# Patient Record
Sex: Female | Born: 1985 | Race: White | Hispanic: No | Marital: Single | Smoking: Former smoker
Health system: Southern US, Community
[De-identification: ages and names within clinical notes are randomized; demographics above are authoritative.]

---
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
## 2006-06-20 IMAGING — CR DG CHEST 2V
1 series · 2 of 2 positions shown · non-contrast
Comparison: none

REASON FOR EXAM: left sided chest pain
COMMENTS:

PROCEDURE:     DXR - DXR CHEST PA (OR AP) AND LATERAL  - June 16, 2004 [DATE]
RESULT:     The lung fields are clear. The heart, mediastinal and osseous
structures show no significant abnormalities.

[Series 1: view not recorded · 0.17mm/px · 2 of 2 slices shown]
[im 1/2]
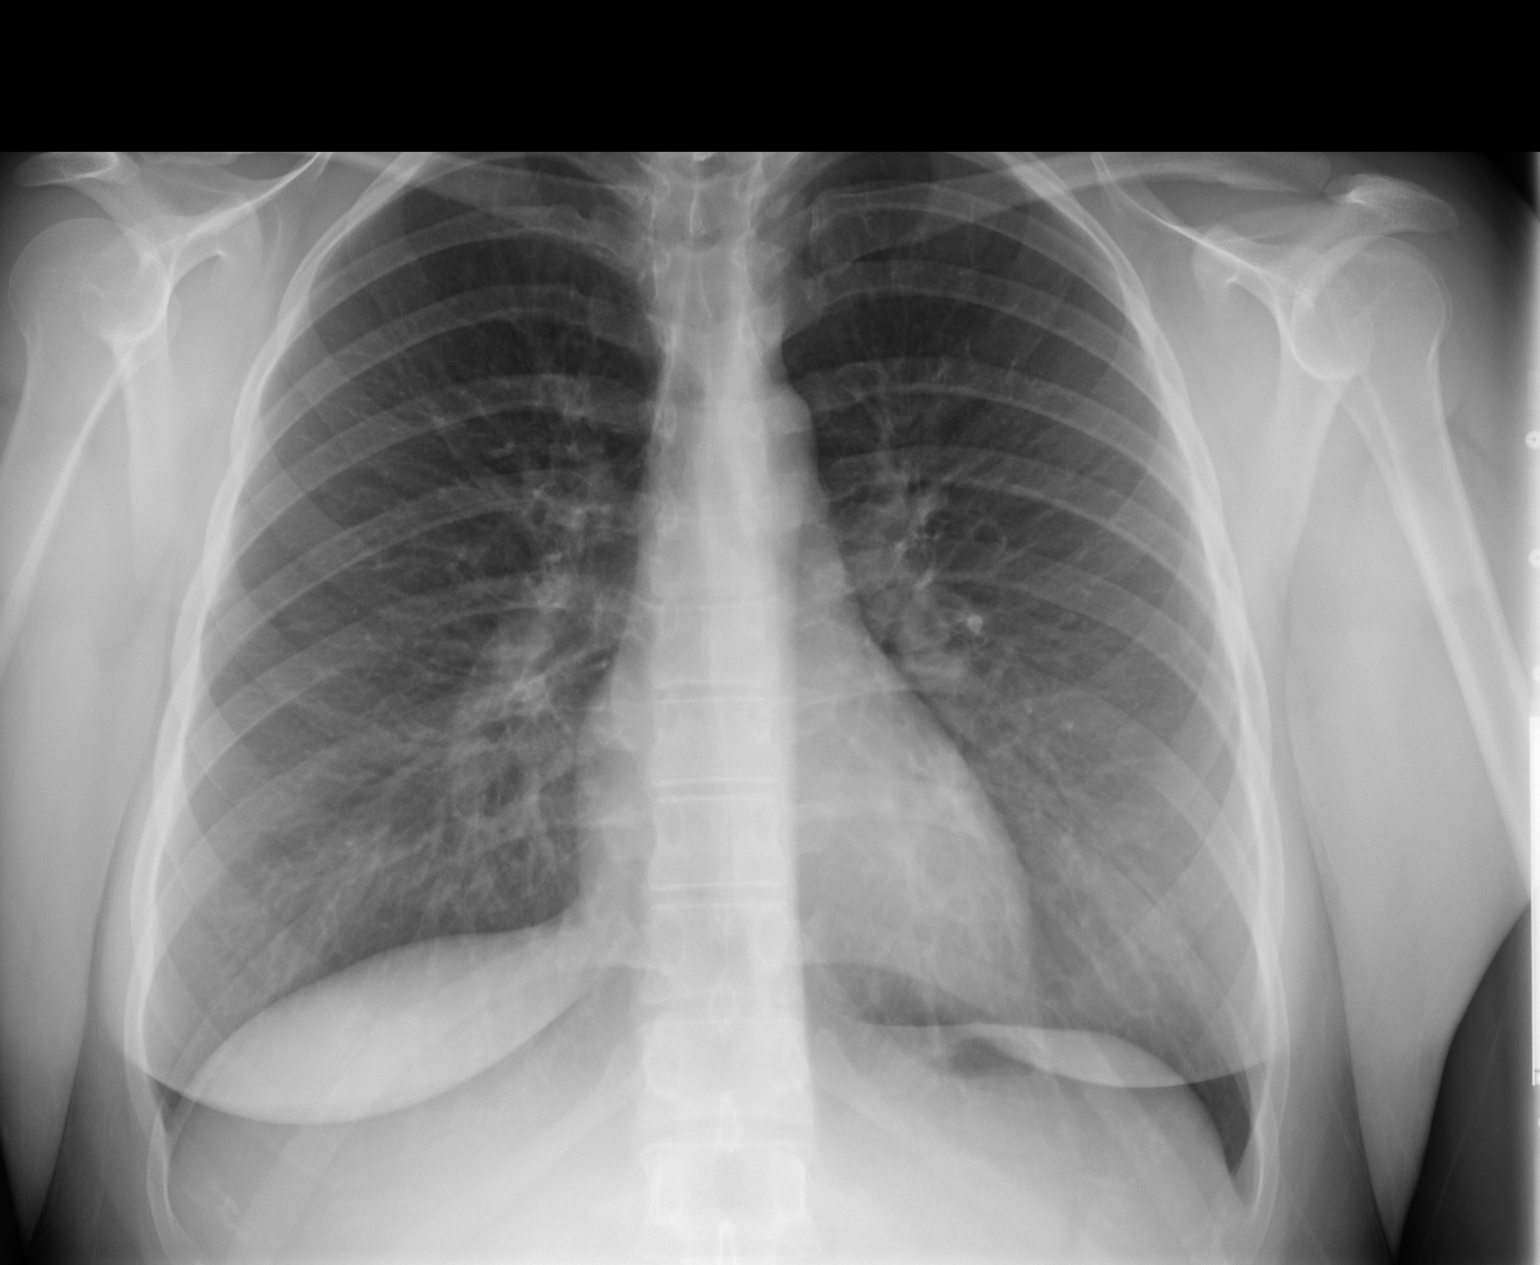
[im 2/2]
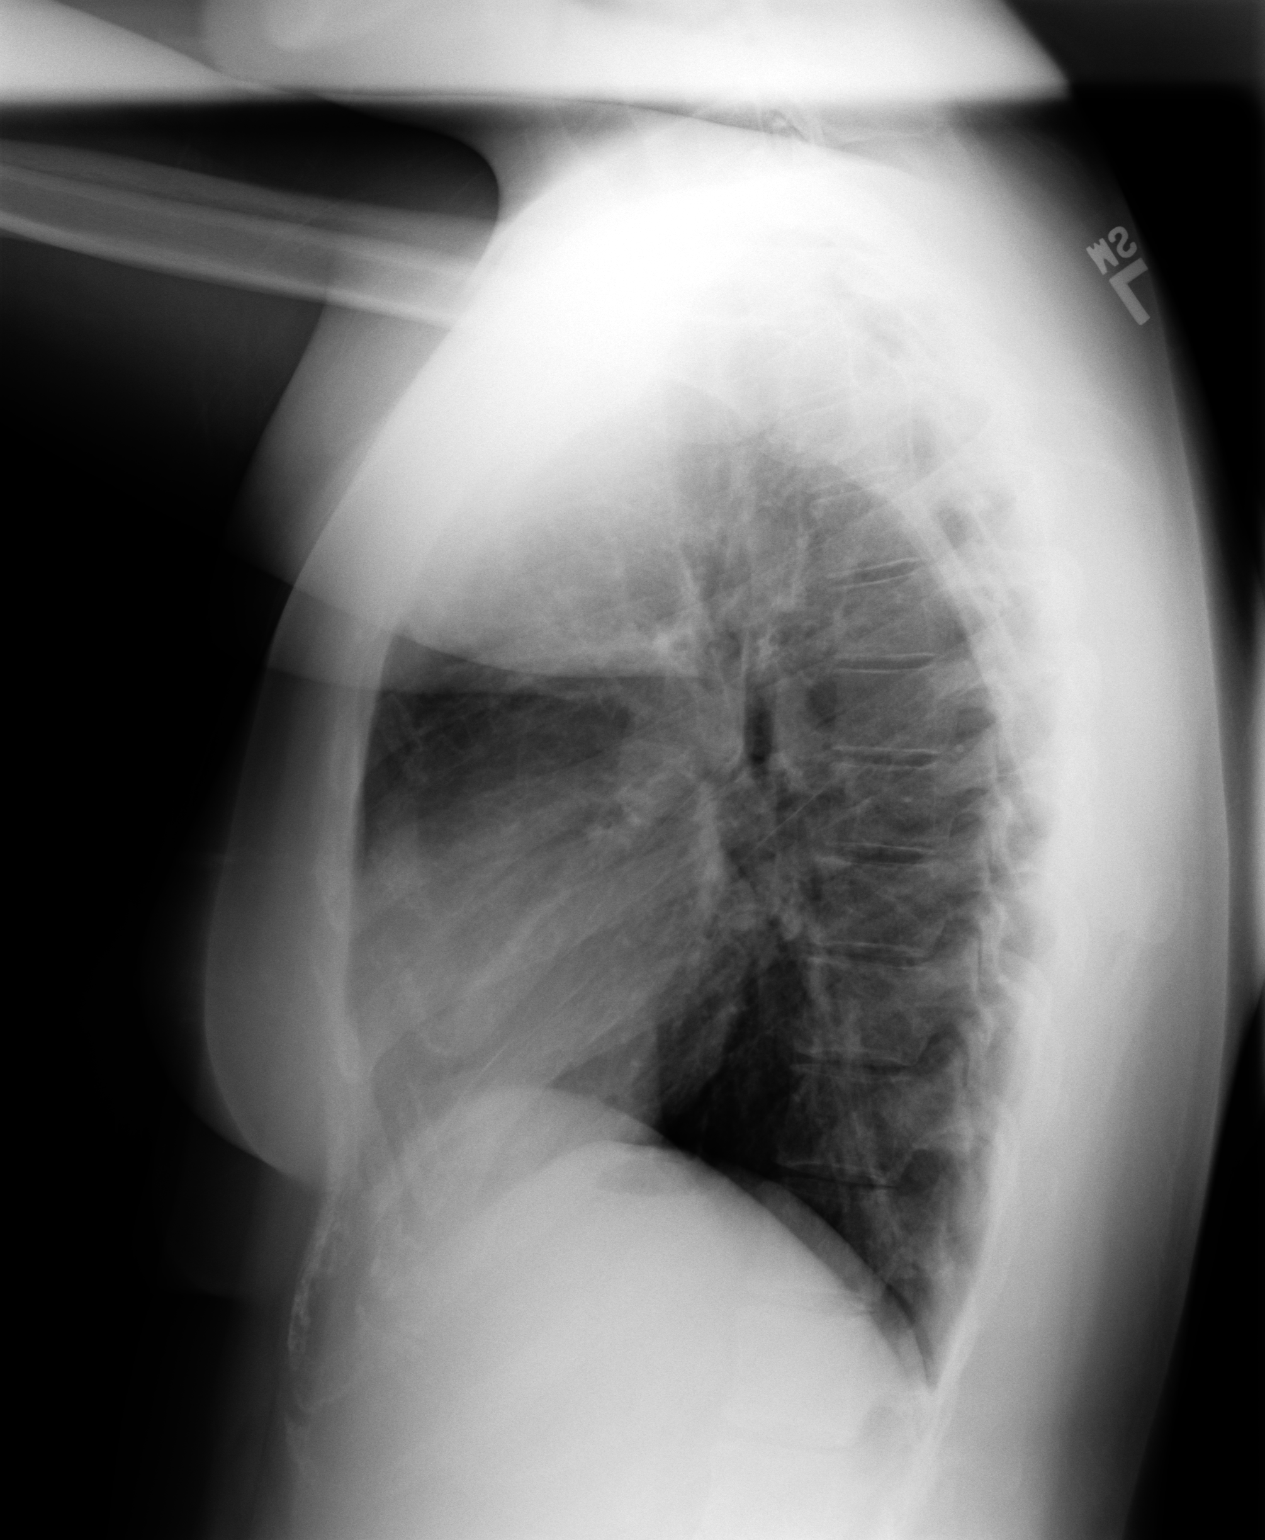

[2 of 2 positions shown; findings below may reference images not displayed]

IMPRESSION: 1)No significant abnormalities are noted.

## 2013-09-13 ENCOUNTER — Emergency Department: Payer: Self-pay | Admitting: Emergency Medicine

## 2015-09-17 IMAGING — CR CERVICAL SPINE - 2-3 VIEW
1 series · 4 of 4 positions shown · non-contrast
Comparison: None.

CLINICAL DATA: Right-sided cervical pain radiating to the right
shoulder for 3 days. No known injury.

EXAM:
CERVICAL SPINE - 2-3 VIEW

[Series 1: dxr c- spine ap and lateral · 0.14mm/px · 4 of 4 slices shown]
[im 1/4]
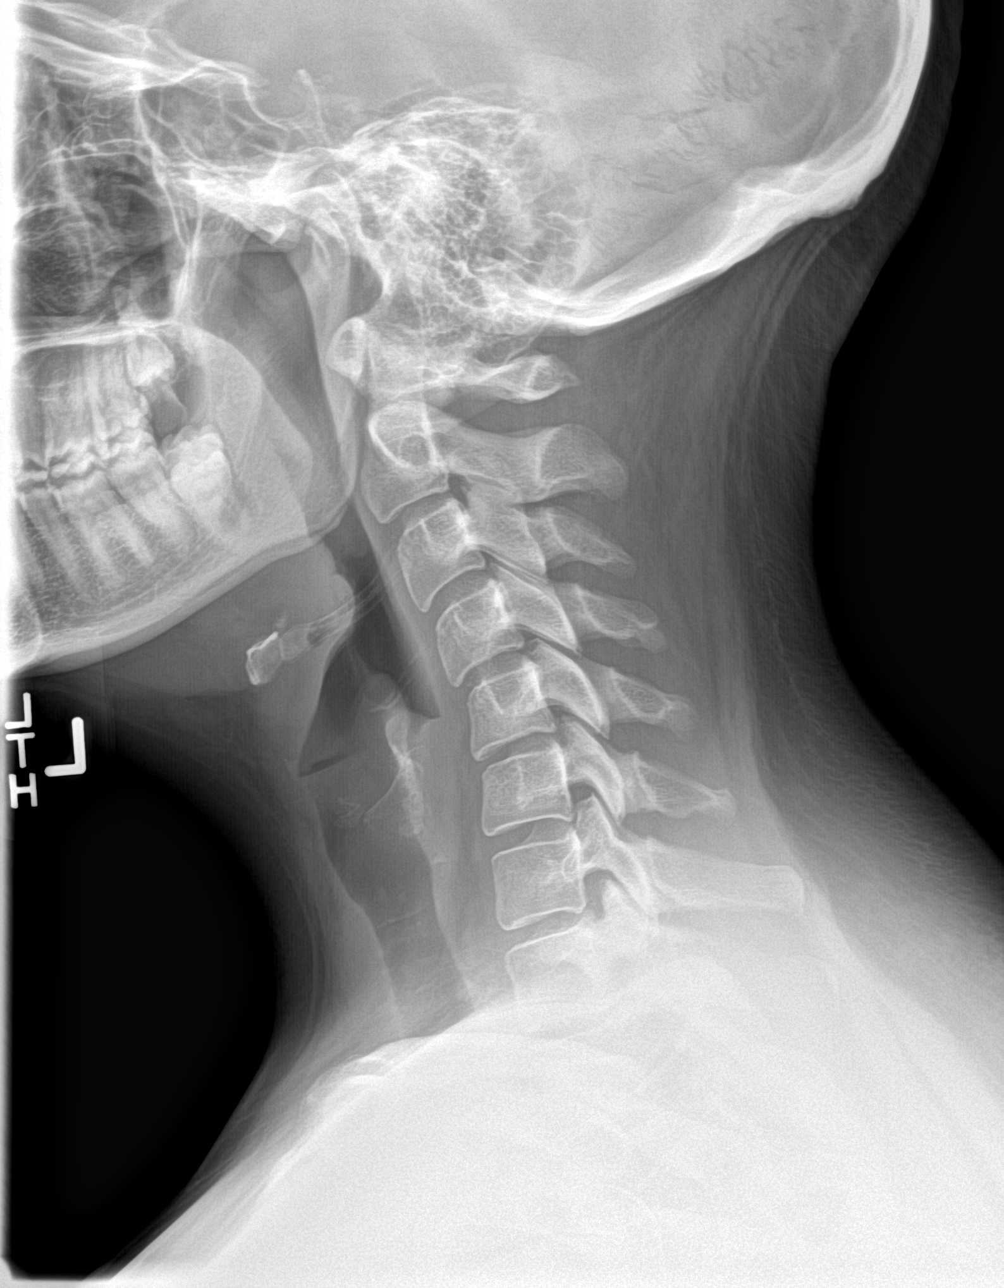
[im 2/4]
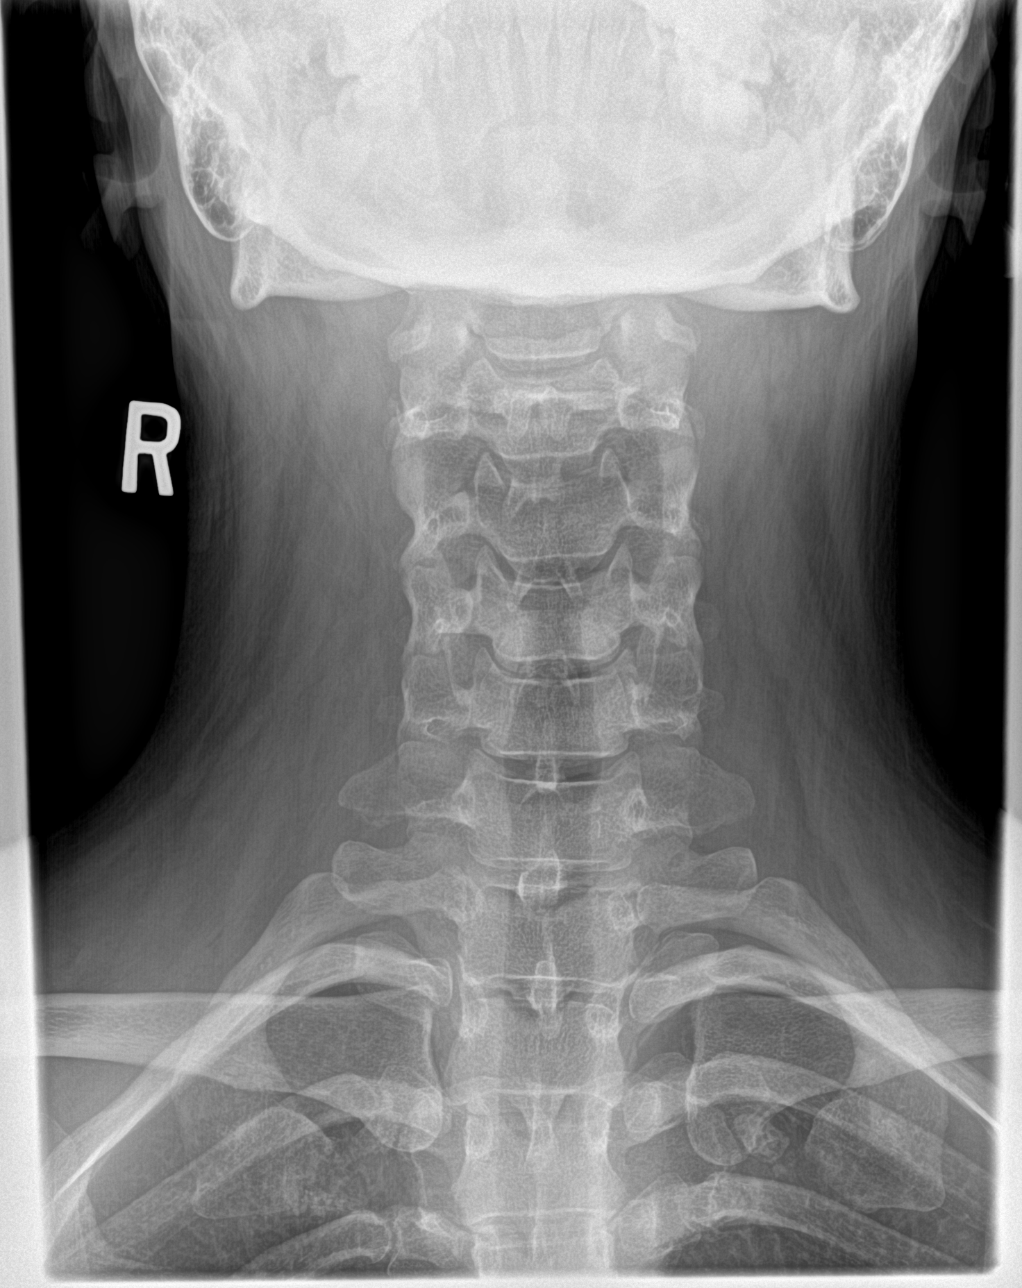
[im 3/4]
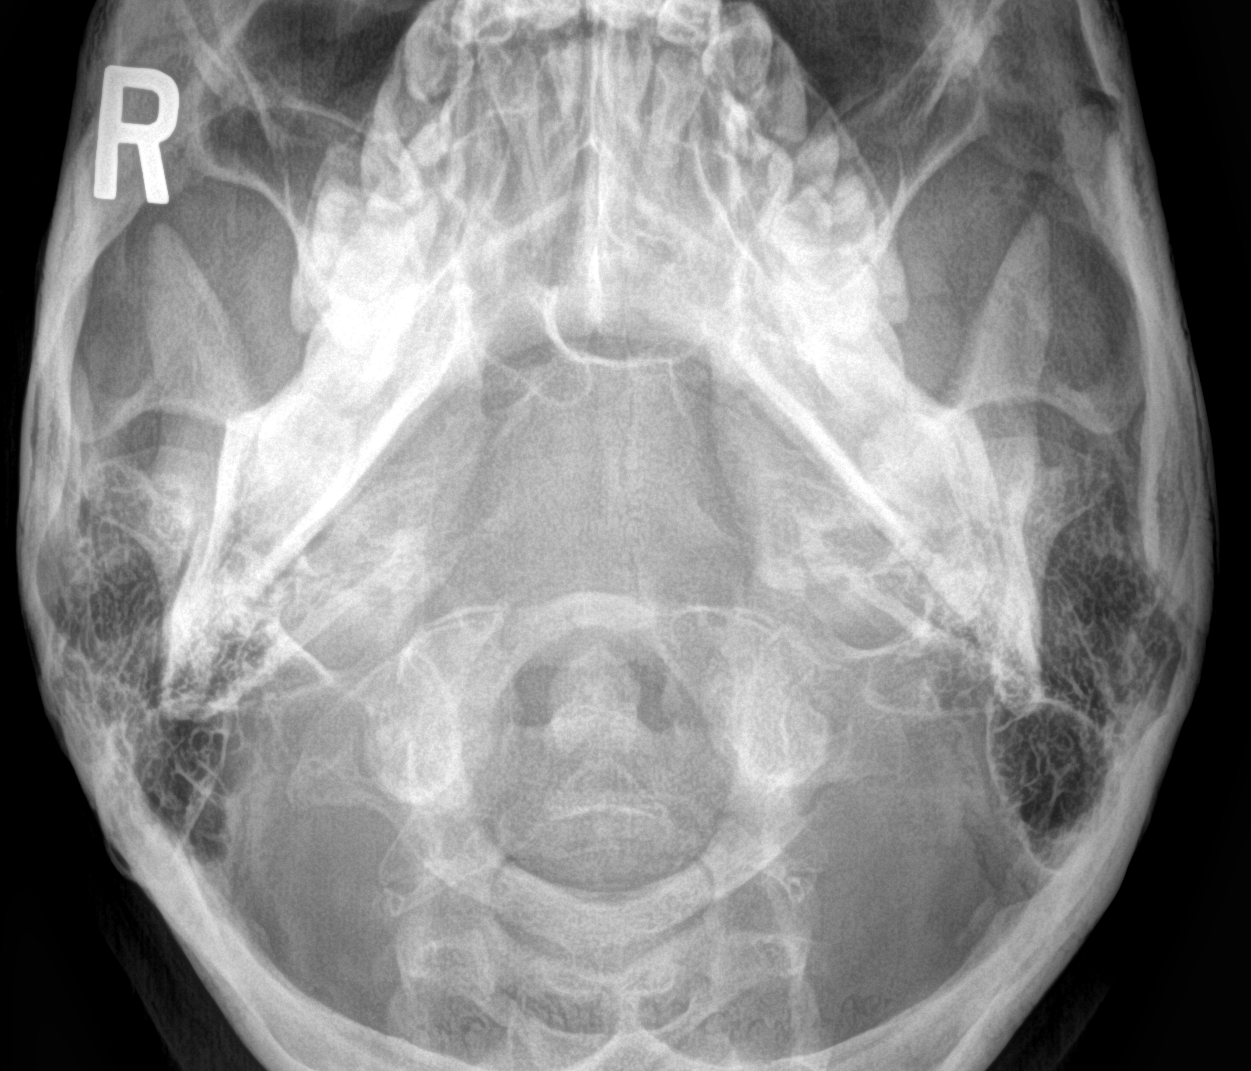
[im 4/4]
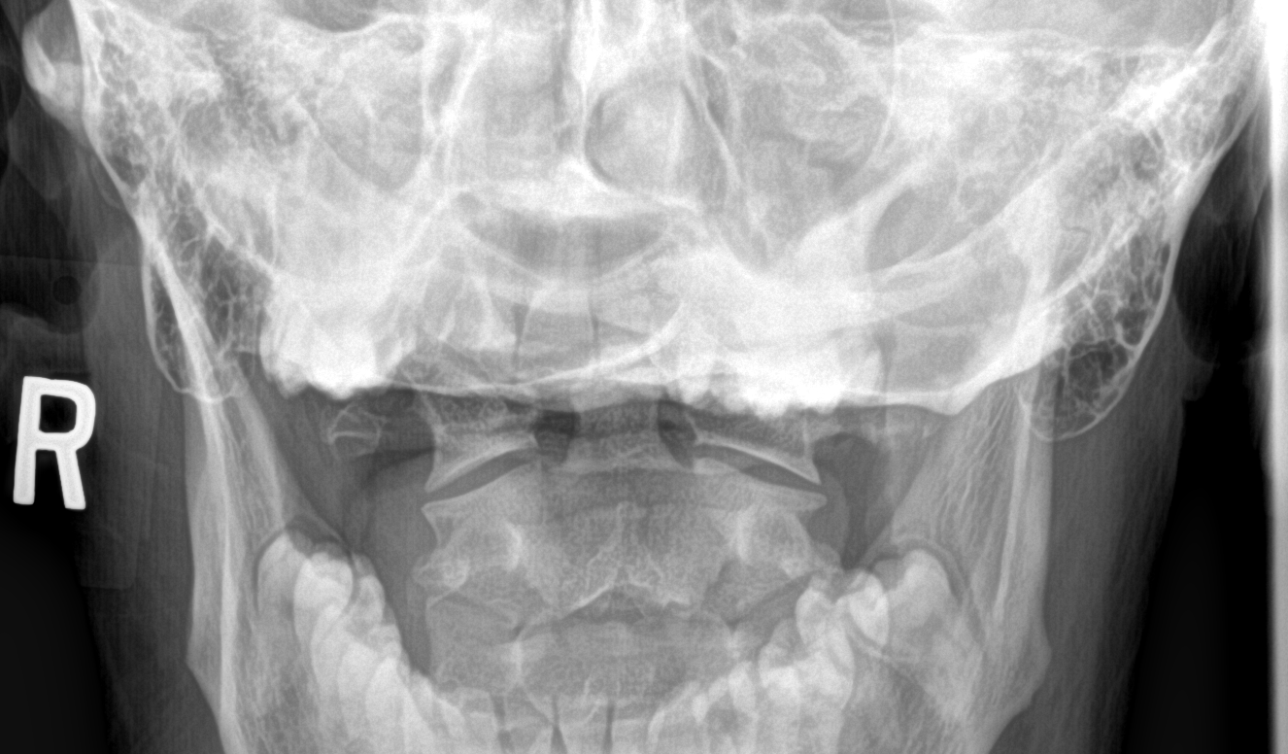

[4 of 4 positions shown; findings below may reference images not displayed]

FINDINGS: C1 to the superior endplate of T2 is imaged on the provided lateral
radiograph.

There is straightening and reversal of the expected cervical
lordosis with mild kyphosis centered about the C4-C5 articulation.
No anterolisthesis or retrolisthesis. There is very mild leftward
deviation the dens between the lateral masses of C1, likely
positional.

Cervical vertebral body heights are preserved. Prevertebral soft
tissues are normal. Intervertebral disc spaces are preserved.

Regional soft tissues are normal. Limited visualization of lung
apices is normal.
IMPRESSION: Straightening and reversal of the expected cervical lordosis,
nonspecific though could be seen in the setting of muscle spasm.
Otherwise, no explanation for patient's radiating neck pain.

## 2016-11-25 ENCOUNTER — Other Ambulatory Visit: Payer: Self-pay

## 2016-11-25 ENCOUNTER — Emergency Department
Admission: EM | Admit: 2016-11-25 | Discharge: 2016-11-25 | Disposition: A | Discharge status: Home / Self Care | Payer: 59 | Attending: Emergency Medicine | Admitting: Emergency Medicine

## 2016-11-25 ENCOUNTER — Emergency Department: Payer: 59

## 2016-11-25 ENCOUNTER — Encounter: Payer: Self-pay | Admitting: Emergency Medicine

## 2016-11-25 DIAGNOSIS — M7989 Other specified soft tissue disorders: Secondary | ICD-10-CM | POA: Insufficient documentation

## 2016-11-25 DIAGNOSIS — M25532 Pain in left wrist: Secondary | ICD-10-CM | POA: Diagnosis present

## 2016-11-25 DIAGNOSIS — M19039 Primary osteoarthritis, unspecified wrist: Secondary | ICD-10-CM

## 2016-11-25 DIAGNOSIS — Z87891 Personal history of nicotine dependence: Secondary | ICD-10-CM | POA: Insufficient documentation

## 2016-11-25 LAB — CBC
HCT: 40 % (ref 35.0–47.0)
Hemoglobin: 13.2 g/dL (ref 12.0–16.0)
MCH: 26.2 pg (ref 26.0–34.0)
MCHC: 32.9 g/dL (ref 32.0–36.0)
MCV: 79.9 fL — ABNORMAL LOW (ref 80.0–100.0)
PLATELETS: 378 10*3/uL (ref 150–440)
RBC: 5.01 MIL/uL (ref 3.80–5.20)
RDW: 15.3 % — AB (ref 11.5–14.5)
WBC: 13.3 10*3/uL — ABNORMAL HIGH (ref 3.6–11.0)

## 2016-11-25 LAB — BASIC METABOLIC PANEL
Anion gap: 8 (ref 5–15)
BUN: 12 mg/dL (ref 6–20)
CALCIUM: 9 mg/dL (ref 8.9–10.3)
CHLORIDE: 103 mmol/L (ref 101–111)
CO2: 25 mmol/L (ref 22–32)
CREATININE: 0.72 mg/dL (ref 0.44–1.00)
GFR calc Af Amer: >60 mL/min (ref >60)
Glucose, Bld: 98 mg/dL (ref 65–99)
Potassium: 4 mmol/L (ref 3.5–5.1)
SODIUM: 136 mmol/L (ref 135–145)

## 2016-11-25 LAB — URIC ACID: URIC ACID, SERUM: 4.7 mg/dL (ref 2.3–6.6)

## 2016-11-25 MED ORDER — PREDNISONE 10 MG PO TABS: ORAL_TABLET | ORAL | 0 refills | Status: AC

## 2016-11-25 MED ORDER — CLINDAMYCIN HCL 300 MG PO CAPS: 300.0000 mg | ORAL_CAPSULE | Freq: Four times a day (QID) | ORAL | 0 refills | Status: AC

## 2016-11-25 MED ORDER — OXYCODONE-ACETAMINOPHEN 5-325 MG PO TABS
1.0000 | ORAL_TABLET | Freq: Once | ORAL | Status: AC
  Administered 2016-11-25: 1 via ORAL
  Filled 2016-11-25: qty 1

## 2016-11-25 MED ORDER — IBUPROFEN 800 MG PO TABS: 800.0000 mg | ORAL_TABLET | Freq: Three times a day (TID) | ORAL | 0 refills | Status: AC | PRN

## 2016-11-25 MED ORDER — KETOROLAC TROMETHAMINE 30 MG/ML IJ SOLN
30.0000 mg | Freq: Once | INTRAMUSCULAR | Status: AC
  Administered 2016-11-25: 30 mg via INTRAMUSCULAR
  Filled 2016-11-25: qty 1

## 2016-11-25 NOTE — ED Triage Notes (Signed)
Left wrist pain.  Onset this morning. Denies injury.  +1 swelling seen.

## 2016-11-25 NOTE — ED Notes (Addendum)
Patient has no recollection of injuring her arm or wrist yesterday prior to going to bed, states she woke at 0200 and wrist was fine, pain woke her at 0500 and wrist and hand started swelling.   Patient removed rings from left hand.  Left wrist and hand swollen 20% larger than right hand and is very warm to the touch. Limited movement. Noticeable bulge on left wrist.  Patient states that her left wrist has swollen more than it was upon arising at 0500

## 2016-11-25 NOTE — ED Provider Notes (Signed)
[facility] Emergency Department Provider Note  ____________________________________________  Time seen: Approximately 11:31 AM  I have reviewed the triage vital signs and the nursing notes.   HISTORY  Chief Complaint Wrist Pain    HPI Chelsea Mclaughlin is a 31 y.o. female that presents to the emergency department for evaluation of left wrist pain and swelling since this morning.  Pain and swelling are primarily over the top of her wrist.  It is painful to move her wrist in all directions.  She is able to move her fingers normally and has good strength in her fingers without pain.  Patient works at [employer] and does a lot of repetitive wrist movements at work.  This has never happened before. No trauma. No alleviating measures have been attempted.  No history of gout.  She has a nonhormonal IUD.  No fever, numbness, tingling, nausea, vomiting.  History reviewed. No pertinent past medical history.  There are no active problems to display for this patient.   History reviewed. No pertinent surgical history.  Prior to Admission medications   Medication Sig Start Date End Date Taking? Authorizing Provider  clindamycin (CLEOCIN) 300 MG capsule Take 1 capsule (300 mg total) 4 (four) times daily for 10 days by mouth. 11/25/16 12/05/16  Enid Derry, PA-C  ibuprofen (ADVIL,MOTRIN) 800 MG tablet Take 1 tablet (800 mg total) every 8 (eight) hours as needed by mouth. 11/25/16   Enid Derry, PA-C  predniSONE (DELTASONE) 10 MG tablet Take 6 tablets on day 1, take 5 tablets on day 2, take 4 tablets on day 3, take 3 tablets on day 4, take 2 tablets on day 5, take 1 tablet on day 6 11/25/16   Enid Derry, PA-C    Allergies Patient has no known allergies.  No family history on file.  Social History Social History   Tobacco Use  . Smoking status: Former Games developer  . Smokeless tobacco: Never Used  Substance Use Topics  . Alcohol use: Not on file  . Drug use: Not on  file     Review of Systems  Constitutional: No fever/chills Cardiovascular: No chest pain. Respiratory: No SOB. Gastrointestinal: No abdominal pain.  No nausea, no vomiting.  Musculoskeletal: Positive for wrist pain Skin: Negative for abrasions, lacerations, ecchymosis. Neurological: Negative for headaches, numbness or tingling   ____________________________________________   PHYSICAL EXAM:  VITAL SIGNS: ED Triage Vitals  Enc Vitals Group     BP 11/25/16 1021 (!) 150/97     Pulse Rate 11/25/16 1021 83     Resp 11/25/16 1021 16     Temp 11/25/16 1021 97.8 F (36.6 C)     Temp Source 11/25/16 1021 Oral     SpO2 11/25/16 1021 99 %     Weight 11/25/16 1020 240 lb (108.9 kg)     Height 11/25/16 1020 5\' 7"  (1.702 m)     Head Circumference --      Peak Flow --      Pain Score 11/25/16 1020 9     Pain Loc --      Pain Edu? --      Excl. in GC? --      Constitutional: Alert and oriented. Well appearing and in no acute distress. Eyes: Conjunctivae are normal. PERRL. EOMI. Head: Atraumatic. ENT:      Ears:      Nose: No congestion/rhinnorhea.      Mouth/Throat: Mucous membranes are moist.  Neck: No stridor.  Cardiovascular: Normal rate, regular rhythm.  Good peripheral circulation. Respiratory: Normal respiratory effort without tachypnea or retractions. Lungs CTAB. Good air entry to the bases with no decreased or absent breath sounds. Musculoskeletal: No gross deformities appreciated.  Moderate swelling to dorsal side of left wrist with central bulge. Pain predominately over the center of wrist where most swelling is. Pain with all wrist movements. No abscess or cyst felt.  No erythema.  Strength 5 out of 5 in all fingers. Neurologic:  Normal speech and language. No gross focal neurologic deficits are appreciated.  Skin:  Skin is warm, dry and intact. No rash noted.   ____________________________________________   LABS (all labs ordered are listed, but only abnormal  results are displayed)  Labs Reviewed  CBC - Abnormal; Notable for the following components:      Result Value   WBC 13.3 (*)    MCV 79.9 (*)    RDW 15.3 (*)    All other components within normal limits  BASIC METABOLIC PANEL  URIC ACID   ____________________________________________  EKG   ____________________________________________  RADIOLOGY Lexine BatonI, Chelsea Mclaughlin, personally viewed and evaluated these images (plain radiographs) as part of my medical decision making, as well as reviewing the written report by the radiologist.  Dg Wrist Complete Left  Result Date: 11/25/2016 CLINICAL DATA:  Left wrist pain and swelling. EXAM: LEFT WRIST - COMPLETE 3+ VIEW COMPARISON:  None. FINDINGS: There is no evidence of fracture or dislocation. There is no evidence of arthropathy or other focal bone abnormality. Soft tissues are unremarkable. IMPRESSION: Negative. Electronically Signed   By: Kennith CenterEric  Mansell M.D.   On: 11/25/2016 11:02    ____________________________________________    PROCEDURES  Procedure(s) performed:    Procedures    Medications  oxyCODONE-acetaminophen (PERCOCET/ROXICET) 5-325 MG per tablet 1 tablet (1 tablet Oral Given 11/25/16 1102)  ketorolac (TORADOL) 30 MG/ML injection 30 mg (30 mg Intramuscular Given 11/25/16 1239)     ____________________________________________   INITIAL IMPRESSION / ASSESSMENT AND PLAN / ED COURSE  Pertinent labs & imaging results that were available during my care of the patient were reviewed by me and considered in my medical decision making (see chart for details).  Review of the [place] CSRS was performed in accordance of the [facility] prior to dispensing any controlled drugs.   Patient presented to the emergency department for evaluation of swelling and pain.  Symptoms are consistent with an inflammatory response of the retinaculum.  Vital signs and exam are reassuring.  She was able to move her wrist and felt better after Percocet.  Appearance is similar to gout. Uric acid not elevated. She has a mildly increased WBC, which is likely due to inflammation. No cyst or abscess felt. We discussed that this could be a developing cyst given the focal area of swelling over the dorsal side of the wrist and this spot is primarily were her pain is. I do not suspect infection but will cover for bacterial cause. No trauma or wound. There is no pain over the underside of her wrist. Patient will be discharged home with prescriptions for ibuprofen, prednisone, clindamycin. Patient is to follow up with PCP and orthopedics as directed. Patient is given ED precautions to return to the ED for any worsening or new symptoms.     ____________________________________________  FINAL CLINICAL IMPRESSION(S) / ED DIAGNOSES  Final diagnoses:  Left wrist pain  Joint inflammation of wrist      NEW MEDICATIONS STARTED DURING THIS VISIT:      This chart was dictated using  voice recognition software/Dragon. Despite best efforts to proofread, errors can occur which can change the meaning. Any change was purely unintentional.    Enid DerryWagner, Chelsea Bise, PA-C 11/26/16 2111    Sharyn CreamerQuale, Mark, MD 11/27/16 [phone]

## 2018-11-29 IMAGING — DX DG WRIST COMPLETE 3+V*L*
4 series · 4 of 4 positions shown · non-contrast
Comparison: None.

CLINICAL DATA: Left wrist pain and swelling.

EXAM:
LEFT WRIST - COMPLETE 3+ VIEW

[wrist ap (1 of 2)]
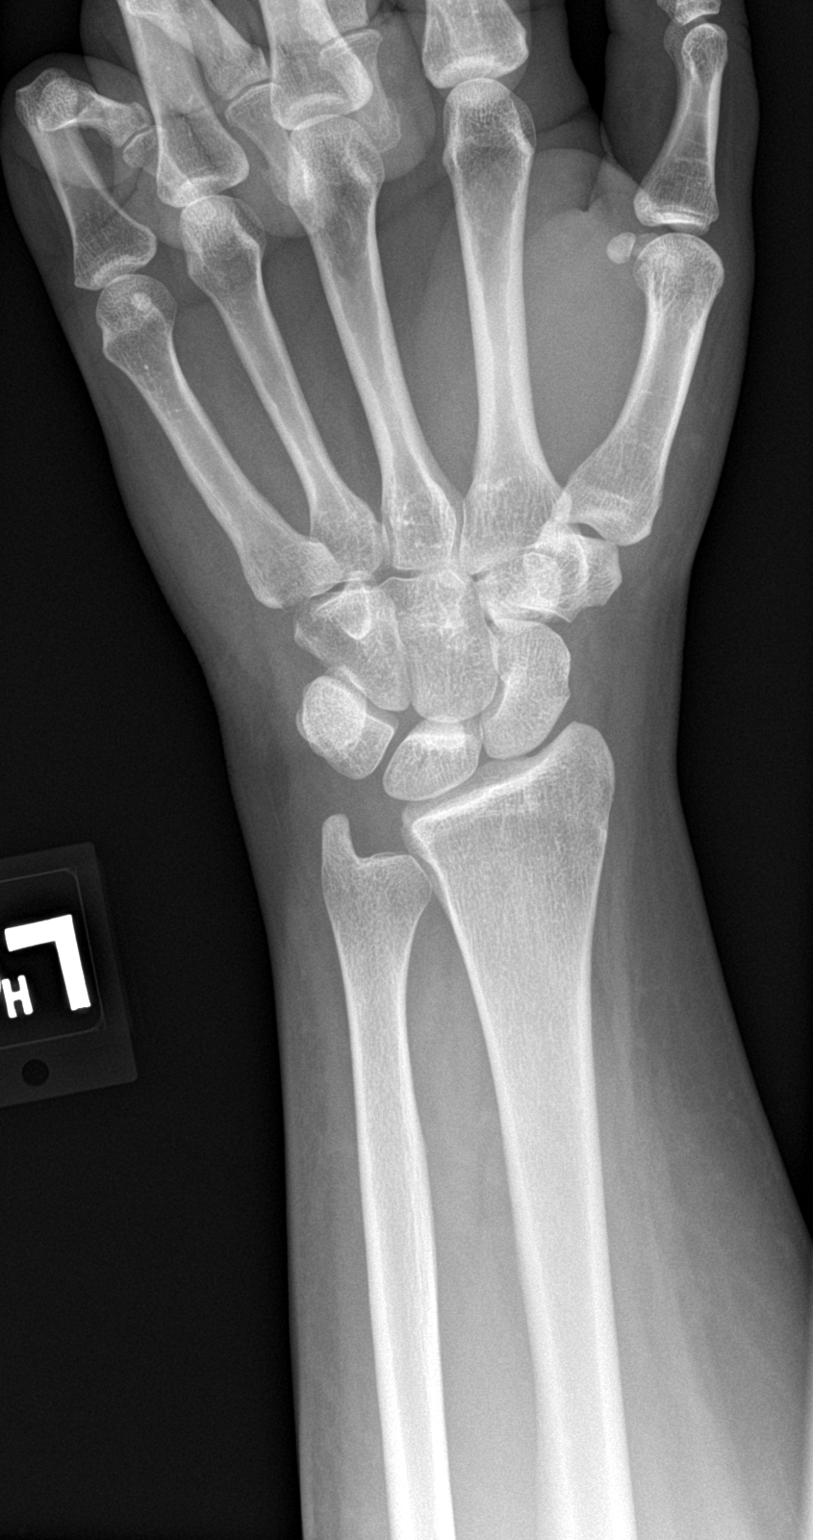

[wrist obl]
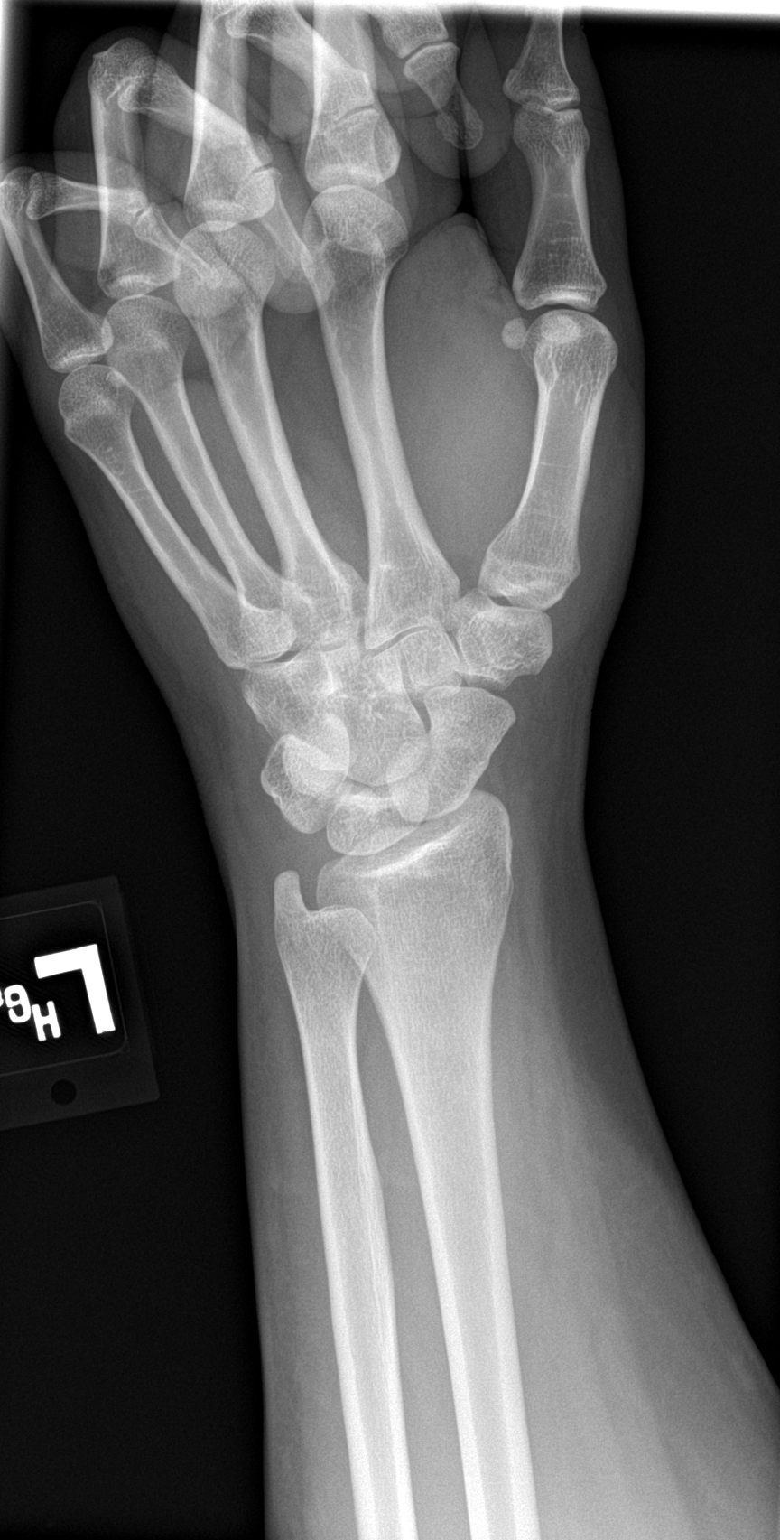

[wrist lat]
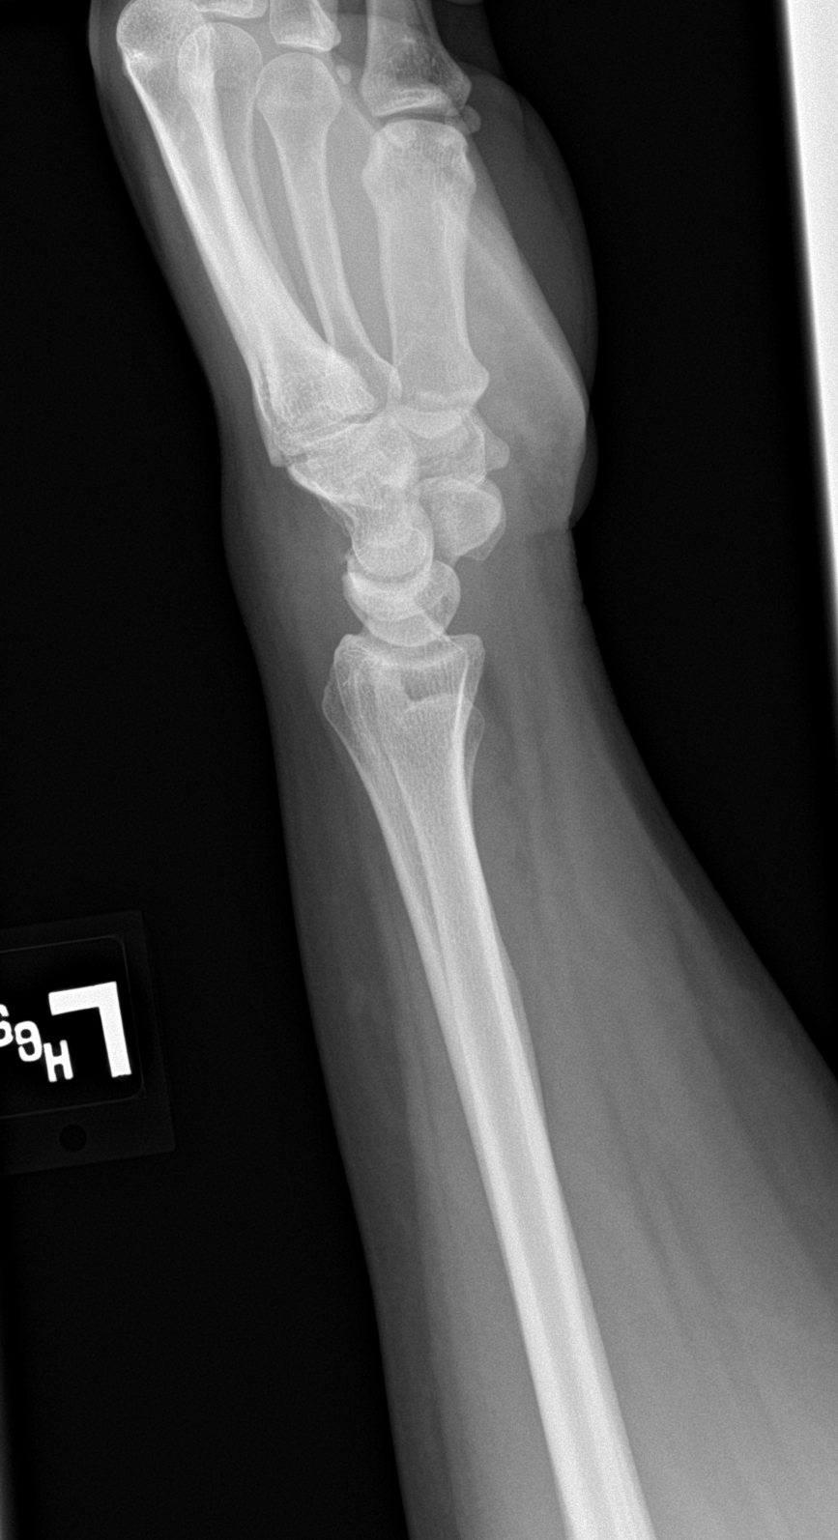

[wrist ap (2 of 2)]
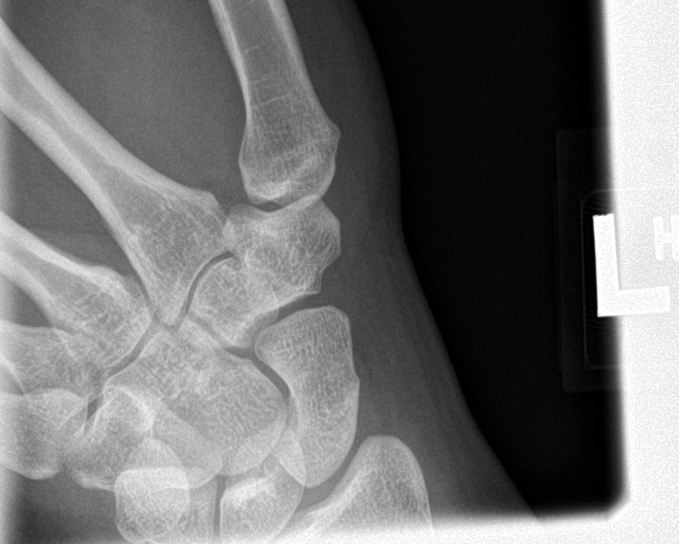

[4 of 4 positions shown; findings below may reference images not displayed]

FINDINGS: There is no evidence of fracture or dislocation. There is no
evidence of arthropathy or other focal bone abnormality. Soft
tissues are unremarkable.
IMPRESSION: Negative.

## 2022-12-03 ENCOUNTER — Encounter: Payer: Self-pay | Admitting: *Deleted

## 2022-12-03 ENCOUNTER — Emergency Department
Admission: EM | Admit: 2022-12-03 | Discharge: 2022-12-03 | Disposition: A | Discharge status: Home / Self Care | Payer: BC Managed Care – PPO | Attending: Emergency Medicine | Admitting: Emergency Medicine

## 2022-12-03 ENCOUNTER — Other Ambulatory Visit: Payer: Self-pay

## 2022-12-03 DIAGNOSIS — L0231 Cutaneous abscess of buttock: Secondary | ICD-10-CM | POA: Diagnosis present

## 2022-12-03 DIAGNOSIS — L0291 Cutaneous abscess, unspecified: Secondary | ICD-10-CM

## 2022-12-03 MED ORDER — SULFAMETHOXAZOLE-TRIMETHOPRIM 800-160 MG PO TABS: 1.0000 | ORAL_TABLET | Freq: Two times a day (BID) | ORAL | 0 refills | Status: AC

## 2022-12-03 MED ORDER — LIDOCAINE HCL (PF) 1 % IJ SOLN
5.0000 mL | Freq: Once | INTRAMUSCULAR | Status: AC
Start: 2022-12-03 — End: 2022-12-03
  Administered 2022-12-03: 5 mL
  Filled 2022-12-03: qty 5

## 2022-12-03 NOTE — ED Triage Notes (Signed)
Pt reports abscess to tailbone area with drainage.  Sx for 2 days.  Pt alert.

## 2022-12-03 NOTE — ED Provider Notes (Signed)
[facility] Provider Note    Event Date/Time   First MD Initiated Contact with Patient 12/03/22 0126     (approximate)   History   Abscess   HPI  Chelsea Mclaughlin is a 37 y.o. female  who presents to the emergency department because of concern for a cyst to her tailbone. The patient states that she first noticed it a couple of days ago but it only really became painful today. She is unable to sit on it without significant pain. She had something similar a number of years ago in the same place. Patient denies any fevers, nausea or vomiting.        Physical Exam   Triage Vital Signs: ED Triage Vitals  Encounter Vitals Group     BP 12/03/22 0045 (!) 132/93     Systolic BP Percentile --      Diastolic BP Percentile --      Pulse Rate 12/03/22 0045 77     Resp 12/03/22 0045 18     Temp 12/03/22 0045 98.4 F (36.9 C)     Temp Source 12/03/22 0045 Oral     SpO2 12/03/22 0045 98 %     Weight 12/03/22 0043 215 lb (97.5 kg)     Height 12/03/22 0043 5\' 7"  (1.702 m)     Head Circumference --      Peak Flow --      Pain Score 12/03/22 0042 7     Pain Loc --      Pain Education --      Exclude from Growth Chart --     Most recent vital signs: Vitals:   12/03/22 0045  BP: (!) 132/93  Pulse: 77  Resp: 18  Temp: 98.4 F (36.9 C)  SpO2: 98%    General: Awake, alert, oriented. CV:  Good peripheral perfusion.  Resp:  Normal effort.  Abd:  No distention.  Other:  Abscess to top of gluteal cleft.    ED Results / Procedures / Treatments   Labs (all labs ordered are listed, but only abnormal results are displayed) Labs Reviewed - No data to display   EKG  None   RADIOLOGY None   PROCEDURES:  Critical Care performed: No  Incision and Drainage of Abscess Location: sacrum Anesthesia Local: 1% Lidocaine   Prep/Procedure: Skin Prep: Chlorahex Incised abscess with #11 blade Purulent discharge: large Packed with 1/4"  gauze Estimated blood loss: 2 ml   MEDICATIONS ORDERED IN ED: Medications - No data to display   IMPRESSION / MDM / ASSESSMENT AND PLAN / ED COURSE  I reviewed the triage vital signs and the nursing notes.                              Differential diagnosis includes, but is not limited to, abscess, infected cyst  Patient's presentation is most consistent with acute presentation with potential threat to life or bodily function.   Patient presented to the emergency department today because of concerns for pain and swelling overlying her tailbone.  On exam she does have an abscess there.  This was incised with a large amount of purulent discharge.  It was packed.  Will plan on given patient prescription for antibiotics given size of abscess.     FINAL CLINICAL IMPRESSION(S) / ED DIAGNOSES   Final diagnoses:  Abscess       Note:  This document was prepared using Dragon  voice recognition software and may include unintentional dictation errors.    Phineas Semen, MD 12/03/22 [phone]
